# Patient Record
Sex: Female | Born: 1985 | Race: White | Hispanic: Yes | Marital: Single | Smoking: Never smoker
Health system: Southern US, Community
[De-identification: ages and names within clinical notes are randomized; demographics above are authoritative.]

---

## 2008-12-14 ENCOUNTER — Ambulatory Visit: Payer: Self-pay | Admitting: Physician Assistant

## 2008-12-14 ENCOUNTER — Inpatient Hospital Stay (HOSPITAL_COMMUNITY): Admission: AD | Admit: 2008-12-14 | Discharge: 2008-12-15 | Payer: Self-pay | Admitting: Obstetrics & Gynecology

## 2009-03-19 ENCOUNTER — Ambulatory Visit (HOSPITAL_COMMUNITY): Admission: RE | Admit: 2009-03-19 | Discharge: 2009-03-19 | Payer: Self-pay | Admitting: Obstetrics & Gynecology

## 2009-07-02 ENCOUNTER — Ambulatory Visit: Payer: Self-pay | Admitting: Obstetrics and Gynecology

## 2009-07-02 ENCOUNTER — Inpatient Hospital Stay (HOSPITAL_COMMUNITY): Admission: AD | Admit: 2009-07-02 | Discharge: 2009-07-04 | Payer: Self-pay | Admitting: Family Medicine

## 2010-07-12 IMAGING — US US OB DETAIL+14 WK
1 of 2 series · 14 of 28 positions shown · non-contrast
Comparison: none

OBSTETRICAL ULTRASOUND:
 This ultrasound exam was performed in the [HOSPITAL] Ultrasound Department.  The OB US report was generated in the AS system, and faxed to the ordering physician.  This report is also available in [HOSPITAL]?s accessANYware and in [REDACTED] PACS.

[Series 1: us ob detail +14 wk · 0.21mm/px · 79 acquisitions, 14 frames shown]
[im 1/79]
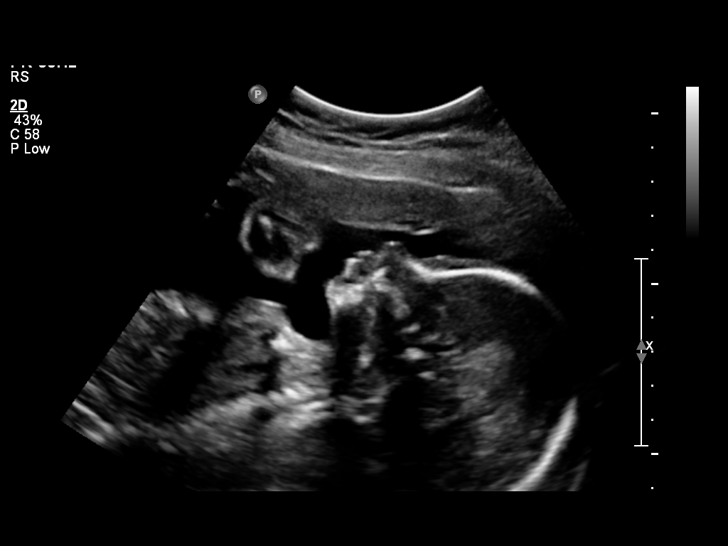
[im 7/79]
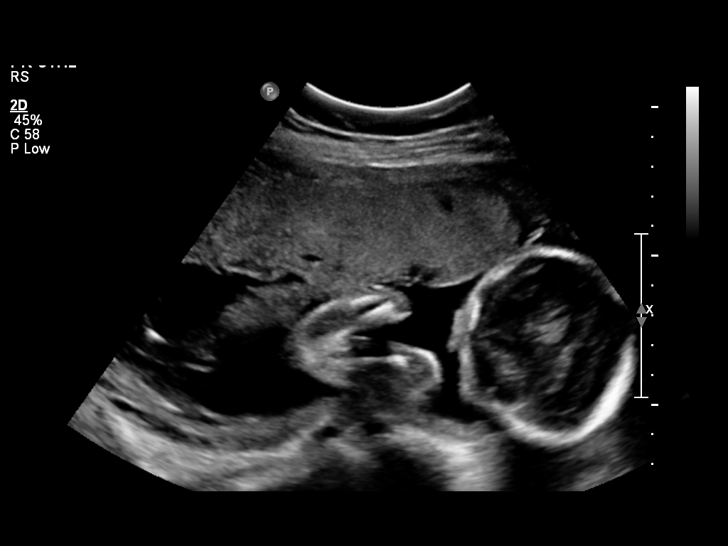
[im 13/79]
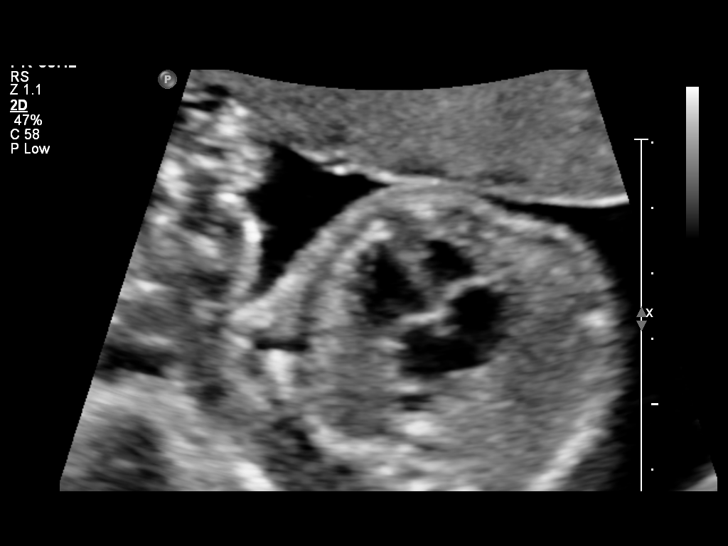
[im 19/79]
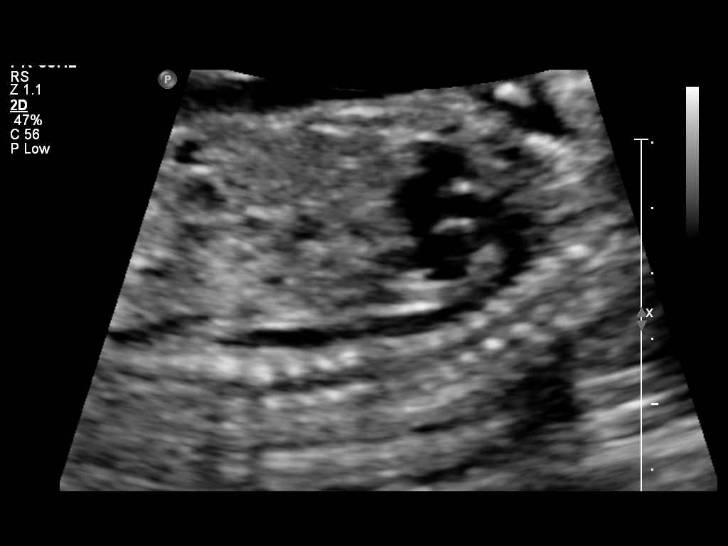
[im 25/79]
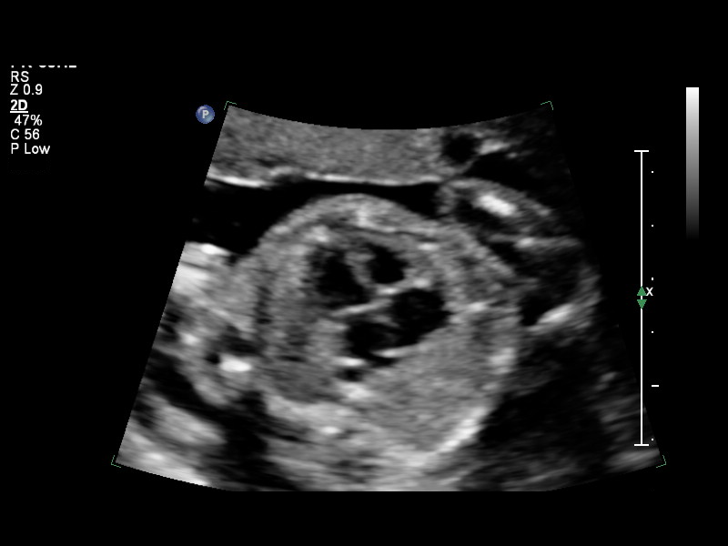
[im 31/79]
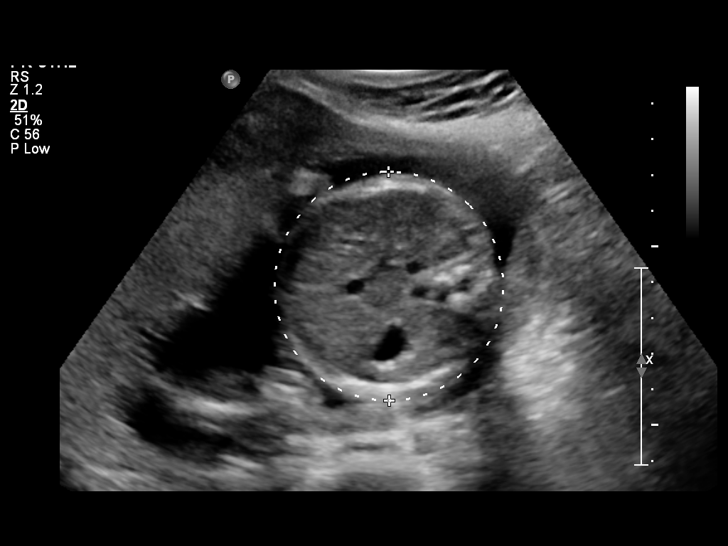
[im 37/79]
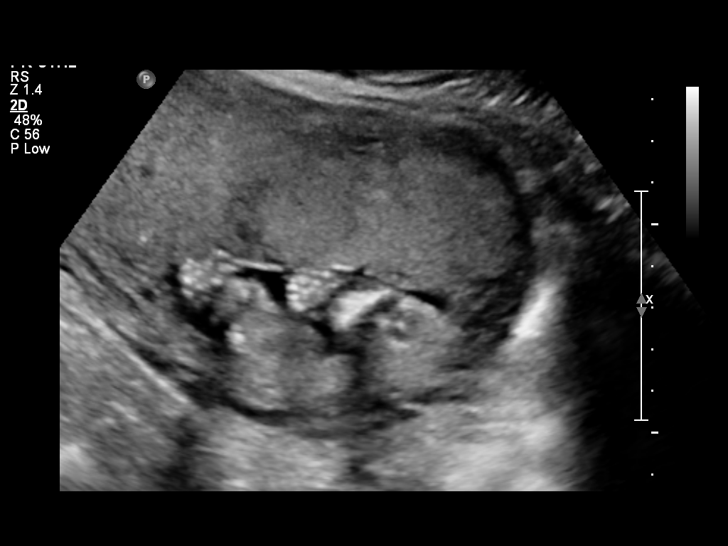
[im 43/79]
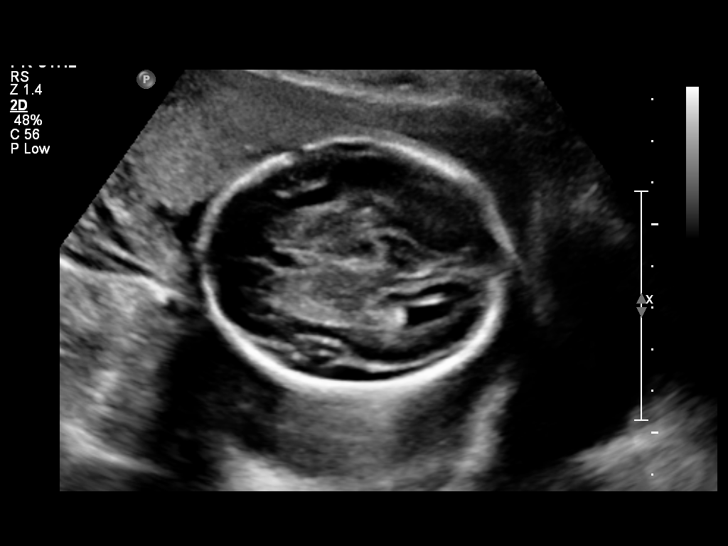
[im 49/79]
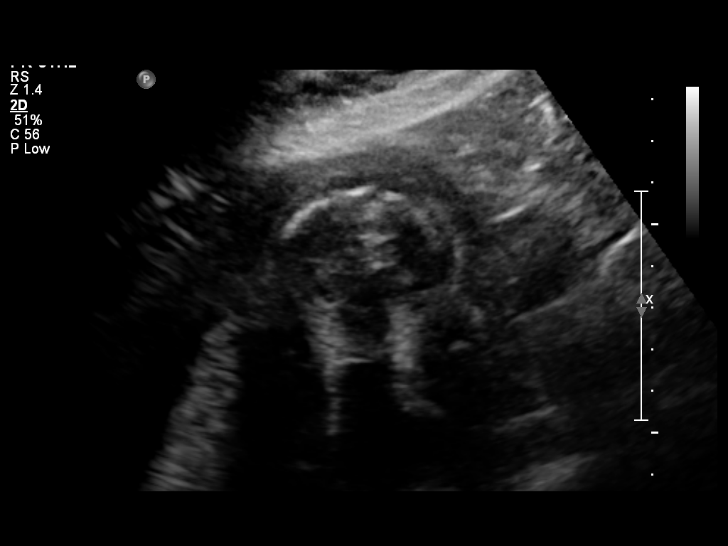
[im 55/79]
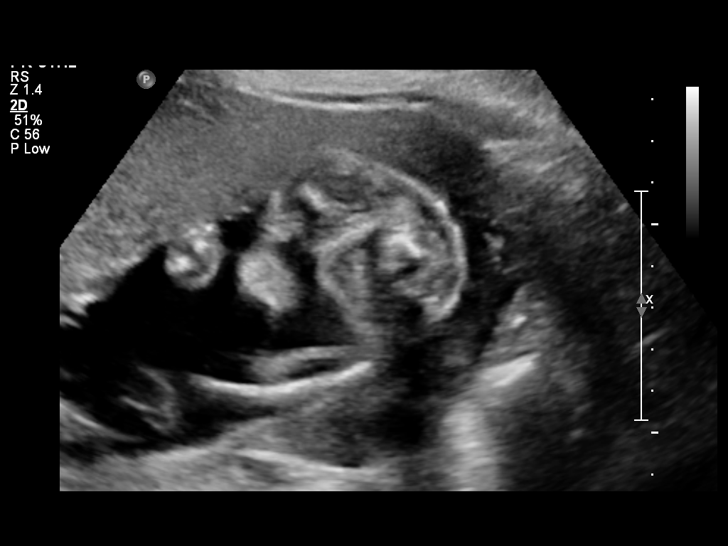
[im 61/79]
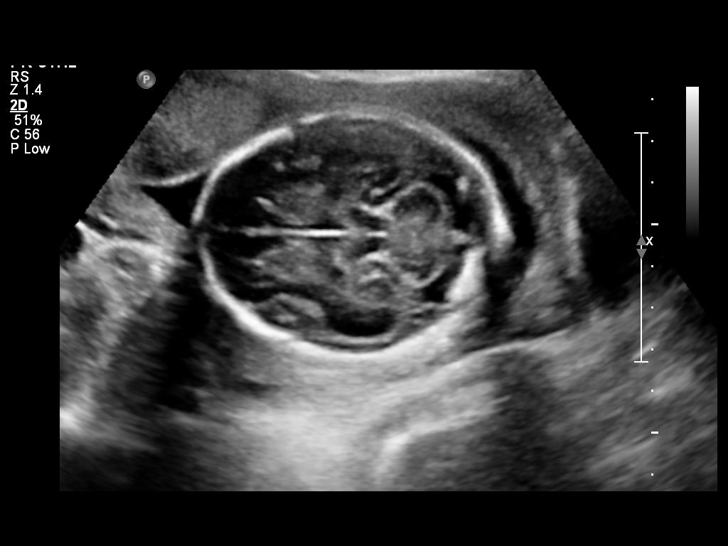
[im 67/79]
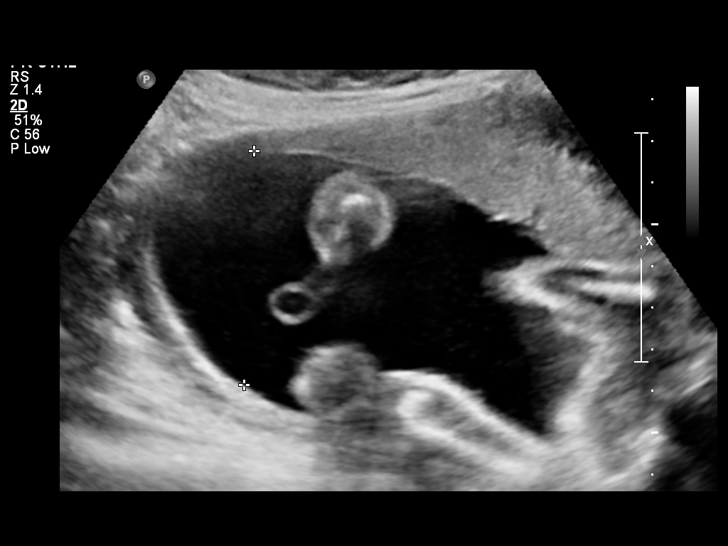
[im 73/79]
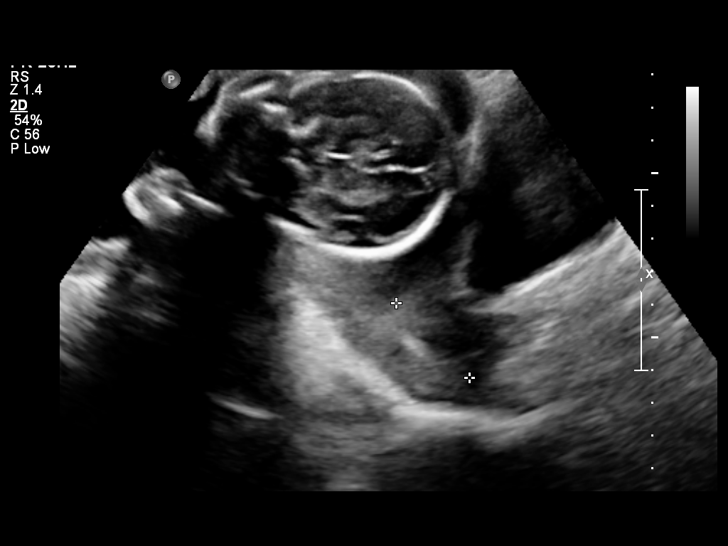
[im 79/79]
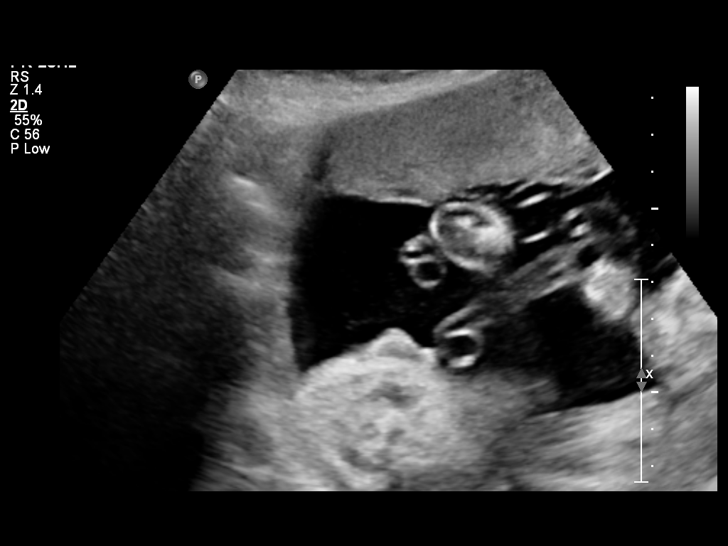

[14 of 28 positions shown; findings below may reference images not displayed]

IMPRESSION: See AS Obstetric US report.

## 2010-09-01 LAB — COMPREHENSIVE METABOLIC PANEL
ALT: 15 U/L (ref 0–35)
AST: 22 U/L (ref 0–37)
Albumin: 3.5 g/dL (ref 3.5–5.2)
GFR calc Af Amer: >60 mL/min (ref >60)
Potassium: 4 mEq/L (ref 3.5–5.1)
Total Bilirubin: 0.3 mg/dL (ref 0.3–1.2)

## 2010-09-01 LAB — CBC
HCT: 45.4 % (ref 36.0–46.0)
Hemoglobin: 15.1 g/dL — ABNORMAL HIGH (ref 12.0–15.0)
MCHC: 33 g/dL (ref 30.0–36.0)
MCHC: 33.4 g/dL (ref 30.0–36.0)
MCV: 85.3 fL (ref 78.0–100.0)
MCV: 86.5 fL (ref 78.0–100.0)
Platelets: 229 10*3/uL (ref 150–400)
Platelets: 269 10*3/uL (ref 150–400)
RBC: 5.32 MIL/uL — ABNORMAL HIGH (ref 3.87–5.11)
RDW: 15.1 % (ref 11.5–15.5)
RDW: 15.2 % (ref 11.5–15.5)

## 2010-09-01 LAB — RPR: RPR Ser Ql: NONREACTIVE

## 2010-09-01 LAB — LACTATE DEHYDROGENASE: LDH: 215 U/L (ref 94–250)

## 2010-09-22 LAB — WET PREP, GENITAL
Clue Cells Wet Prep HPF POC: NONE SEEN
Trich, Wet Prep: NONE SEEN
Yeast Wet Prep HPF POC: NONE SEEN

## 2010-09-22 LAB — URINALYSIS, ROUTINE W REFLEX MICROSCOPIC
Bilirubin Urine: NEGATIVE
Glucose, UA: NEGATIVE mg/dL
Ketones, ur: NEGATIVE mg/dL
Leukocytes, UA: NEGATIVE
Nitrite: NEGATIVE
Protein, ur: NEGATIVE mg/dL
Urobilinogen, UA: 0.2 mg/dL (ref 0.0–1.0)

## 2010-09-22 LAB — URINE MICROSCOPIC-ADD ON: WBC, UA: NONE SEEN WBC/hpf (ref <3)

## 2010-09-22 LAB — GC/CHLAMYDIA PROBE AMP, GENITAL: GC Probe Amp, Genital: NEGATIVE

## 2015-01-02 ENCOUNTER — Ambulatory Visit: Payer: Self-pay | Attending: Internal Medicine

## 2016-01-17 LAB — OB RESULTS CONSOLE GC/CHLAMYDIA
Chlamydia: NEGATIVE
Gonorrhea: NEGATIVE

## 2016-01-17 LAB — OB RESULTS CONSOLE ABO/RH: "RH Type ": POSITIVE

## 2016-01-17 LAB — OB RESULTS CONSOLE RPR: RPR: NONREACTIVE

## 2016-01-17 LAB — OB RESULTS CONSOLE HIV ANTIBODY (ROUTINE TESTING): HIV: NONREACTIVE

## 2016-01-17 LAB — OB RESULTS CONSOLE ANTIBODY SCREEN: Antibody Screen: NEGATIVE

## 2016-01-17 LAB — OB RESULTS CONSOLE RUBELLA ANTIBODY, IGM: Rubella: IMMUNE

## 2016-01-17 LAB — OB RESULTS CONSOLE HEPATITIS B SURFACE ANTIGEN: Hepatitis B Surface Ag: NEGATIVE

## 2016-06-16 NOTE — L&D Delivery Note (Signed)
OB Delivery Summary Note  31 y.o. G3P2002 at 5826w0d delivered a viable female infant in cephalic, ROA position. There was no nuchal cord present. The anterior shoulder delivered with ease. A 60 sec delayed cord clamping was performed. Cord clamped x2 and cut. Placenta delivered spontaneously intact, with 3VC. Fundus firm on exam with massage and pitocin. Good hemostasis noted.  Anesthesia: No epidural Laceration: None Suture: None Good hemostasis noted. EBL: 100 cc  Mom and baby recovering in LDR.    Apgars: APGAR (1 MIN): 9   APGAR (5 MINS): 9   Weight: Pending skin to skin    Gorden HarmsMegan Campbell, MD PGY-2 07/07/2016, 4:10 PM   OB FELLOW DELIVERY ATTESTATION  I was gloved and present for the delivery in its entirety, and I agree with the above resident's note.    Jen MowElizabeth Mumaw, DO OB Fellow 4:23 PM

## 2016-06-23 LAB — OB RESULTS CONSOLE GBS: GBS: NEGATIVE

## 2016-07-07 ENCOUNTER — Encounter (HOSPITAL_COMMUNITY): Payer: Self-pay | Admitting: *Deleted

## 2016-07-07 ENCOUNTER — Inpatient Hospital Stay (HOSPITAL_COMMUNITY)
Admission: AD | Admit: 2016-07-07 | Discharge: 2016-07-08 | DRG: 775 | Disposition: A | Discharge status: Home / Self Care | Payer: Medicaid Other | Source: Ambulatory Visit | Attending: Obstetrics & Gynecology | Admitting: Obstetrics & Gynecology

## 2016-07-07 DIAGNOSIS — Z3493 Encounter for supervision of normal pregnancy, unspecified, third trimester: Secondary | ICD-10-CM | POA: Diagnosis present

## 2016-07-07 DIAGNOSIS — Z3A39 39 weeks gestation of pregnancy: Secondary | ICD-10-CM

## 2016-07-07 DIAGNOSIS — Z3490 Encounter for supervision of normal pregnancy, unspecified, unspecified trimester: Secondary | ICD-10-CM

## 2016-07-07 LAB — CBC
HCT: 40.1 % (ref 36.0–46.0)
Hemoglobin: 13.7 g/dL (ref 12.0–15.0)
MCH: 27.5 pg (ref 26.0–34.0)
MCHC: 34.2 g/dL (ref 30.0–36.0)
MCV: 80.5 fL (ref 78.0–100.0)
PLATELETS: 287 10*3/uL (ref 150–400)
RBC: 4.98 MIL/uL (ref 3.87–5.11)
RDW: 15.5 % (ref 11.5–15.5)
WBC: 12.7 10*3/uL — ABNORMAL HIGH (ref 4.0–10.5)

## 2016-07-07 LAB — TYPE AND SCREEN
ABO/RH(D): O POS
Antibody Screen: NEGATIVE

## 2016-07-07 LAB — ABO/RH: ABO/RH(D): O POS

## 2016-07-07 LAB — RPR: RPR Ser Ql: NONREACTIVE

## 2016-07-07 MED ORDER — BENZOCAINE-MENTHOL 20-0.5 % EX AERO: 1.0000 "application " | INHALATION_SPRAY | CUTANEOUS | Status: DC | PRN

## 2016-07-07 MED ORDER — OXYCODONE-ACETAMINOPHEN 5-325 MG PO TABS: 2.0000 | ORAL_TABLET | ORAL | Status: DC | PRN

## 2016-07-07 MED ORDER — LACTATED RINGERS IV SOLN: INTRAVENOUS | Status: DC

## 2016-07-07 MED ORDER — SIMETHICONE 80 MG PO CHEW: 80.0000 mg | CHEWABLE_TABLET | ORAL | Status: DC | PRN

## 2016-07-07 MED ORDER — FENTANYL CITRATE (PF) 100 MCG/2ML IJ SOLN
100.0000 ug | INTRAMUSCULAR | Status: DC | PRN
  Administered 2016-07-07 (×2): 100 ug via INTRAVENOUS
  Filled 2016-07-07 (×2): qty 2

## 2016-07-07 MED ORDER — LACTATED RINGERS IV SOLN: 500.0000 mL | INTRAVENOUS | Status: DC | PRN

## 2016-07-07 MED ORDER — LIDOCAINE HCL (PF) 1 % IJ SOLN
30.0000 mL | INTRAMUSCULAR | Status: DC | PRN
Start: 2016-07-07 — End: 2016-07-07
  Filled 2016-07-07: qty 30

## 2016-07-07 MED ORDER — SOD CITRATE-CITRIC ACID 500-334 MG/5ML PO SOLN: 30.0000 mL | ORAL | Status: DC | PRN

## 2016-07-07 MED ORDER — COCONUT OIL OIL: 1.0000 "application " | TOPICAL_OIL | Status: DC | PRN

## 2016-07-07 MED ORDER — DIBUCAINE 1 % RE OINT: 1.0000 "application " | TOPICAL_OINTMENT | RECTAL | Status: DC | PRN

## 2016-07-07 MED ORDER — FLEET ENEMA 7-19 GM/118ML RE ENEM: 1.0000 | ENEMA | RECTAL | Status: DC | PRN

## 2016-07-07 MED ORDER — ONDANSETRON HCL 4 MG/2ML IJ SOLN: 4.0000 mg | Freq: Four times a day (QID) | INTRAMUSCULAR | Status: DC | PRN

## 2016-07-07 MED ORDER — ONDANSETRON HCL 4 MG/2ML IJ SOLN: 4.0000 mg | INTRAMUSCULAR | Status: DC | PRN

## 2016-07-07 MED ORDER — OXYCODONE-ACETAMINOPHEN 5-325 MG PO TABS: 1.0000 | ORAL_TABLET | ORAL | Status: DC | PRN

## 2016-07-07 MED ORDER — LIDOCAINE HCL (PF) 1 % IJ SOLN: 30.0000 mL | INTRAMUSCULAR | Status: DC | PRN

## 2016-07-07 MED ORDER — ZOLPIDEM TARTRATE 5 MG PO TABS: 5.0000 mg | ORAL_TABLET | Freq: Every evening | ORAL | Status: DC | PRN

## 2016-07-07 MED ORDER — ACETAMINOPHEN 325 MG PO TABS
650.0000 mg | ORAL_TABLET | ORAL | Status: DC | PRN
  Administered 2016-07-07: 650 mg via ORAL
  Filled 2016-07-07: qty 2

## 2016-07-07 MED ORDER — ONDANSETRON HCL 4 MG PO TABS: 4.0000 mg | ORAL_TABLET | ORAL | Status: DC | PRN

## 2016-07-07 MED ORDER — ACETAMINOPHEN 325 MG PO TABS: 650.0000 mg | ORAL_TABLET | ORAL | Status: DC | PRN

## 2016-07-07 MED ORDER — PRENATAL MULTIVITAMIN CH
1.0000 | ORAL_TABLET | Freq: Every day | ORAL | Status: DC
  Administered 2016-07-08: 1 via ORAL
  Filled 2016-07-07: qty 1

## 2016-07-07 MED ORDER — TETANUS-DIPHTH-ACELL PERTUSSIS 5-2.5-18.5 LF-MCG/0.5 IM SUSP: 0.5000 mL | Freq: Once | INTRAMUSCULAR | Status: DC

## 2016-07-07 MED ORDER — LACTATED RINGERS IV SOLN
INTRAVENOUS | Status: DC
Start: 2016-07-07 — End: 2016-07-07
  Administered 2016-07-07: 09:00:00 via INTRAVENOUS

## 2016-07-07 MED ORDER — OXYTOCIN BOLUS FROM INFUSION
500.0000 mL | Freq: Once | INTRAVENOUS | Status: AC
  Administered 2016-07-07: 500 mL via INTRAVENOUS

## 2016-07-07 MED ORDER — OXYTOCIN BOLUS FROM INFUSION: 500.0000 mL | Freq: Once | INTRAVENOUS | Status: DC

## 2016-07-07 MED ORDER — OXYTOCIN 40 UNITS IN LACTATED RINGERS INFUSION - SIMPLE MED: 2.5000 [IU]/h | INTRAVENOUS | Status: DC

## 2016-07-07 MED ORDER — WITCH HAZEL-GLYCERIN EX PADS: 1.0000 "application " | MEDICATED_PAD | CUTANEOUS | Status: DC | PRN

## 2016-07-07 MED ORDER — OXYTOCIN 40 UNITS IN LACTATED RINGERS INFUSION - SIMPLE MED
2.5000 [IU]/h | INTRAVENOUS | Status: DC
  Administered 2016-07-07: 2.5 [IU]/h via INTRAVENOUS
  Filled 2016-07-07: qty 1000

## 2016-07-07 MED ORDER — DIPHENHYDRAMINE HCL 25 MG PO CAPS: 25.0000 mg | ORAL_CAPSULE | Freq: Four times a day (QID) | ORAL | Status: DC | PRN

## 2016-07-07 MED ORDER — IBUPROFEN 600 MG PO TABS
600.0000 mg | ORAL_TABLET | Freq: Four times a day (QID) | ORAL | Status: DC
  Administered 2016-07-07 – 2016-07-08 (×4): 600 mg via ORAL
  Filled 2016-07-07 (×4): qty 1

## 2016-07-07 MED ORDER — SENNOSIDES-DOCUSATE SODIUM 8.6-50 MG PO TABS
2.0000 | ORAL_TABLET | ORAL | Status: DC
  Administered 2016-07-08: 2 via ORAL
  Filled 2016-07-07: qty 2

## 2016-07-07 NOTE — MAU Note (Signed)
Pt C/O uc's since 0400, has spotting, denies LOF.

## 2016-07-07 NOTE — Progress Notes (Signed)
I assisted RN Morrie Sheldonshley with admission questions, ordered pt meals too , by Orlan LeavensViria Alvarez Spanish Interpreter.

## 2016-07-07 NOTE — Progress Notes (Signed)
Admission education and paperwork completed with in house interpreter Viria.  Explained baby safety and fall safety.  Instructed pt not to get up without calling for nurse to come to room to assist.  Pt verbalizes understanding.

## 2016-07-07 NOTE — Lactation Note (Signed)
This note was copied from a baby's chart. Lactation Consultation Note  Patient Name: Tara Pearson'UToday's Date: 07/07/2016 Reason for consult: Initial assessment   Baby STS after delivery with mom.  Baby cueing and rooting.  Interpreter at bedside with LC.  Mom has previous BF experience of 2/12 years with first child now 13 yr. Old and 1 year of BF with now 723 year old.  Mom wishes to formula and bf her infant.  Mom states she fed both to her other children as well.  LC facilitated getting mom in a comfortable position, providing pillows for baby to latch.  Infant latched on right side in cradle hold.  Baby had good rhythmic jaw motions and swallows were heard with breast compression and breast massage.  Mom was taught hand expression and multiple gtts of colostrum were noted upon expression.  With interpreter LC reviewed LC services and support groups as well as bf resource sheet.  Mom expressed that infant is already breastfeeding easier than the other two of her children.  Mom understands to call out for further questions or concerns.    Maternal Data Formula Feeding for Exclusion: No Has patient been taught Hand Expression?: Yes (with interpreter at bedside) Does the patient have breastfeeding experience prior to this delivery?: Yes (bf 2 1/2 years with 1st child 90(13 yr old).  1 year with 143 year old)  Feeding Feeding Type: Breast Fed Length of feed: 45 min  LATCH Score/Interventions Latch: Grasps breast easily, tongue down, lips flanged, rhythmical sucking.  Audible Swallowing: Spontaneous and intermittent  Type of Nipple: Everted at rest and after stimulation  Comfort (Breast/Nipple): Soft / non-tender     Hold (Positioning): Assistance needed to correctly position infant at breast and maintain latch. Intervention(s): Breastfeeding basics reviewed;Support Pillows;Position options;Skin to skin  LATCH Score: 9  Lactation Tools Discussed/Used     Consult Status Consult  Status: Follow-up Date: 07/08/16 Follow-up type: In-patient    Maryruth HancockKelly Suzanne [facility] 07/07/2016, 4:59 PM

## 2016-07-07 NOTE — H&P (Signed)
LABOR AND DELIVERY ADMISSION HISTORY AND PHYSICAL NOTE  Tara Pearson is a 31 y.o. female 313P2002 with IUP at 3022w0d by LMP presenting for spontaneous onset of labor with increasing strength and intensity of contractions.   She reports positive fetal movement. She denies leakage of fluid or vaginal bleeding.  Prenatal History/Complications: None  Past Medical History: History reviewed. No pertinent past medical history.  Past Surgical History: History reviewed. No pertinent surgical history.  Obstetrical History: OB History    Gravida Para Term Preterm AB Living   3 2 2     2    SAB TAB Ectopic Multiple Live Births                  Social History: Social History   Social History  . Marital status: Single    Spouse name: N/A  . Number of children: N/A  . Years of education: N/A   Social History Main Topics  . Smoking status: Never Smoker  . Smokeless tobacco: Never Used  . Alcohol use No  . Drug use: No  . Sexual activity: Not Asked   Other Topics Concern  . None   Social History Narrative  . None    Family History: History reviewed. No pertinent family history.  Allergies: No Known Allergies  Prescriptions Prior to Admission  Medication Sig Dispense Refill Last Dose  . Prenatal Vit-Fe Fumarate-FA (MULTIVITAMIN-PRENATAL) 27-0.8 MG TABS tablet Take 1 tablet by mouth daily at 12 noon.   07/06/2016     Review of Systems   All systems reviewed and negative except as stated in HPI  Blood pressure 125/90, pulse 79, temperature 98.1 F (36.7 C), temperature source Oral, resp. rate 20, height 5\' 2"  (1.575 m), weight 191 lb (86.6 kg), SpO2 100 %. General appearance: alert, cooperative and no distress Lungs: clear to auscultation bilaterally Heart: regular rate and rhythm Abdomen: soft, non-tender; bowel sounds normal Extremities: No calf swelling or tenderness Presentation: cephalic Fetal monitoring: FHT with baseline in the 130s, +accels, - decels, mod  variability Uterine activity: appears to have ctx approximately every 3 minutes, minimal detection on monitor Dilation: 4.5 Effacement (%): 70 Station: -3 Exam by:: Janeth Rasehristina Robinson RNC   Prenatal labs: ABO, Rh: O/Positive/-- (08/03 0000) Antibody: Negative (08/03 0000) Rubella: !Error! RPR: Nonreactive (08/03 0000)  HBsAg: Negative (08/03 0000)  HIV: Non-reactive (08/03 0000)  GBS: Negative (01/08 0000)  1 hr Glucola: normal Genetic screening: normal Anatomy US: normal  Prenatal Transfer Tool  Maternal Diabetes: No Genetic Screening: Normal Maternal Ultrasounds/Referrals: Normal Fetal Ultrasounds or other Referrals:  None Maternal Substance Abuse:  No Significant Maternal Medications:  None Significant Maternal Lab Results: Lab values include: Group B Strep negative  Results for orders placed or performed during the hospital encounter of 07/07/16 (from the past 24 hour(s))  CBC   Collection Time: 07/07/16  8:57 AM  Result Value Ref Range   WBC 12.7 (H) 4.0 - 10.5 K/uL   RBC 4.98 3.87 - 5.11 MIL/uL   Hemoglobin 13.7 12.0 - 15.0 g/dL   HCT 62.140.1 30.836.0 - 65.746.0 %   MCV 80.5 78.0 - 100.0 fL   MCH 27.5 26.0 - 34.0 pg   MCHC 34.2 30.0 - 36.0 g/dL   RDW 84.615.5 96.211.5 - 95.215.5 %   Platelets 287 150 - 400 K/uL    Patient Active Problem List   Diagnosis Date Noted  . Indication for care in labor or delivery 07/07/2016    Assessment: Tara Pearson is a 31  y.o. [id number] at [redacted]w[redacted]d here for spontaneous onset of labor.   #Labor:Admitted with plan to continue with expectant management.  #Pain: Will try IV and PO pain medications. Consider Epidural when appropriate.  #FWB: Cat I #ID:  GBS negative- no abx indicated #MOF: breast, may supplement with bottle #MOC: Considering BTL, uncertain if papers have been signed.  #Circ:  N/A  Lise Auer, MD PGY-2 07/07/2016, 9:52 AM   OB FELLOW HISTORY AND PHYSICAL ATTESTATION  I have seen and examined this patient; I agree with  above documentation in the resident's note.    Jen Mow, DO OB Fellow 07/07/2016, 9:59 AM

## 2016-07-07 NOTE — Anesthesia Pain Management Evaluation Note (Signed)
  CRNA Pain Management Visit Note  Patient: Tara Pearson, 31 y.o., female  "Hello [facility] am a [facility]. We have an anesthesia team available at all times to provide care throughout the hospital, including epidural management and anesthesia for C-section. I don't know your plan for the delivery whether it a natural birth, water birth, IV sedation, nitrous supplementation, doula or epidural, but we want to meet your pain goals."   1.Was your pain managed to your expectations on prior hospitalizations?   Yes   2.What is your expectation for pain management during this hospitalization?     Labor support without medications  3.How can we help you reach that goal?   Record the patient's initial score and the patient's pain goal.   Pain: 8  Pain Goal: 10 [facility] wants you to be able to say your pain was always managed very well.  Laban EmperorMalinova,Cherrie Franca Hristova 07/07/2016

## 2016-07-08 DIAGNOSIS — Z3A39 39 weeks gestation of pregnancy: Secondary | ICD-10-CM

## 2016-07-08 LAB — CBC
HCT: 39.1 % (ref 36.0–46.0)
HEMOGLOBIN: 13.2 g/dL (ref 12.0–15.0)
MCH: 27 pg (ref 26.0–34.0)
MCHC: 33.8 g/dL (ref 30.0–36.0)
MCV: 80.1 fL (ref 78.0–100.0)
Platelets: 267 10*3/uL (ref 150–400)
RBC: 4.88 MIL/uL (ref 3.87–5.11)
RDW: 15.7 % — ABNORMAL HIGH (ref 11.5–15.5)
WBC: 17.7 10*3/uL — ABNORMAL HIGH (ref 4.0–10.5)

## 2016-07-08 MED ORDER — IBUPROFEN 600 MG PO TABS
600.0000 mg | ORAL_TABLET | Freq: Four times a day (QID) | ORAL | 0 refills | Status: AC
Start: 2016-07-08 — End: ?

## 2016-07-08 MED ORDER — SENNOSIDES-DOCUSATE SODIUM 8.6-50 MG PO TABS: 2.0000 | ORAL_TABLET | ORAL | Status: AC

## 2016-07-08 NOTE — Discharge Summary (Signed)
OB Discharge Summary     Patient Name: Tara Pearson DOB: 08-21-1985 MRN: [id number]  Date of admission: 07/07/2016 Delivering MD: Gorden Harms C   Date of discharge: 07/08/2016  Admitting diagnosis: 39WKS,PAIN Intrauterine pregnancy: [redacted]w[redacted]d     Secondary diagnosis:  Principal Problem:   SVD (spontaneous vaginal delivery) Active Problems:   Indication for care in labor or delivery   Pregnant and not yet delivered  Additional problems: None     Discharge diagnosis: Term Pregnancy Delivered                                                                                                Post partum procedures:None  Augmentation: None  Complications: None  Hospital course:  Onset of Labor With Vaginal Delivery     31 y.o. yo [id number] at [redacted]w[redacted]d was admitted in Active Labor on 07/07/2016. Patient had an uncomplicated labor course as follows:  Membrane Rupture Time/Date: 2:45 PM ,07/07/2016   Intrapartum Procedures: Episiotomy: None [1]                                         Lacerations:  None [1]  Patient had a delivery of a Viable infant. 07/07/2016  Information for the patient's newborn:  Yani, [employer] Girl Akina [[id number]]  Delivery Method: Vag-Spont    Pateint had an uncomplicated postpartum course.  She is ambulating, tolerating a regular diet, passing flatus, and urinating well. Patient is discharged home in stable condition on 07/08/16.   Physical exam  Vitals:   07/07/16 1631 07/07/16 1644 07/07/16 1745 07/07/16 2145  BP: 115/67 118/70 (!) 120/58 101/61  Pulse: 63 67 82 75  Resp:  (!) 24 18   Temp:  98.4 F (36.9 C) 97.8 F (36.6 C) 98.1 F (36.7 C)  TempSrc:  Oral Axillary Axillary  SpO2:   97%   Weight:      Height:       General: alert, cooperative and no distress Lochia: appropriate Uterine Fundus: firm Incision: N/A DVT Evaluation: No evidence of DVT seen on physical exam. No significant calf/ankle edema. Labs: Lab Results  Component Value Date   WBC 17.7 (H) 07/08/2016   HGB 13.2 07/08/2016   HCT 39.1 07/08/2016   MCV 80.1 07/08/2016   PLT 267 07/08/2016   CMP Latest Ref Rng & Units 07/02/2009  Glucose 70 - 99 mg/dL 74  BUN 6 - 23 mg/dL 10  Creatinine 0.4 - 1.2 mg/dL 6.96  Sodium 295 - 284 mEq/L 137  Potassium 3.5 - 5.1 mEq/L 4.0  Chloride 96 - 112 mEq/L 101  CO2 19 - 32 mEq/L 25  Calcium 8.4 - 10.5 mg/dL 9.8  Total Protein 6.0 - 8.3 g/dL 7.6  Total Bilirubin 0.3 - 1.2 mg/dL 0.3  Alkaline Phos 39 - 117 U/L 258(H)  AST 0 - 37 U/L 22  ALT 0 - 35 U/L 15    Discharge instruction: per After Visit Summary and "Baby and Me Booklet".  After visit meds:  Allergies as  of 07/08/2016   No Known Allergies     Medication List    TAKE these medications   ibuprofen 600 MG tablet Commonly known as:  ADVIL,MOTRIN Take 1 tablet (600 mg total) by mouth every 6 (six) hours.   multivitamin-prenatal 27-0.8 MG Tabs tablet Take 1 tablet by mouth daily at 12 noon.   senna-docusate 8.6-50 MG tablet Commonly known as:  Senokot-S Take 2 tablets by mouth daily. Start taking on:  07/09/2016       Diet: routine diet  Activity: Advance as tolerated. Pelvic rest for 6 weeks.   Outpatient follow up:6 weeks Follow up Appt:No future appointments. Follow up Visit:No Follow-up on file.  Postpartum contraception: Undecided  Newborn Data: Live born female  Birth Weight: 6 lb 12.1 oz (3065 g) APGAR: 9, 9  Baby Feeding: Bottle and Breast Disposition:home with mother   07/08/2016 Gorden HarmsMegan Makayla Lanter, MD

## 2016-07-08 NOTE — Plan of Care (Signed)
Problem: Activity: Goal: Ability to tolerate increased activity will improve Outcome: Completed/Met Date Met: 07/08/16 Pt ambulating independently in the room.  Encouraged to ambulate in the hallway.

## 2016-07-08 NOTE — Progress Notes (Signed)
Ordered patients meals,  by Orlan LeavensViria Alvarez Spanish Interpreter.

## 2016-07-08 NOTE — Discharge Instructions (Signed)
Parto vaginal, cuidados de puerperio  (Postpartum Care After Vaginal Delivery)  El período de tiempo que sigue inmediatamente al parto se conoce como puerperio.  ¿QUÉ TIPO DE ATENCIÓN MÉDICA RECIBIRÉ?  · Podría continuar recibiendo medicamentos y líquidos través de una vía intravenosa (IV) que se colocará en una de sus venas.  · Si se le realizó una incisión cerca de la vagina (episiotomía) o si ha tenido algún desgarro durante el parto, podrían indicarle que se coloque compresas frías sobre la episiotomía o el desgarro. Esto ayuda a aliviar el dolor y la hinchazón.  · Es posible que le den una botella rociadora para que use cuando vaya al baño. Puede utilizarla hasta que se sienta cómoda limpiándose de la manera habitual. Siga los pasos a continuación para usar la botella rociadora:  ? Antes de orinar, llene la botella rociadora con agua tibia. No use agua caliente.  ? Después de orinar, mientras aún está sentada en el inodoro, use la botella rociadora para enjuagar el área alrededor de la uretra y la abertura vaginal. Con esto podrá limpiar cualquier rastro de orina y sangre.  ? Puede hacer esto en lugar de secarse. Cuando comience a sanar, podrá usar la botella rociadora antes de secarse. Asegúrese de secarse suavemente.  ? Llene la botella rociadora con agua limpia cada vez que vaya al baño.  · Deberá usar apósitos sanitarios.    ¿CÓMO PUEDO SENTIRME?  · Quizás no tenga necesidad de orinar durante varias horas después del parto.  · Sentirá algo de dolor y molestias en el abdomen y la vagina.  · Si está amamantando, podría tener contracciones uterinas cada vez que lo haga. Estas podrían prolongarse hasta varias semanas durante el puerperio. Las contracciones uterinas ayudan al útero a regresar a su tamaño habitual.  · Es normal tener un poco de hemorragia vaginal (loquios) después del parto. La cantidad y apariencia de los loquios a menudo es similar a las del período menstrual la primera semana después del  parto. Disminuirá gradualmente las siguientes semanas hasta convertirse en una descarga seca amarronada o amarillenta. En la mayoría de las mujeres, los loquios se detienen completamente entre 6 a 8 semanas después del parto. Los sangrados vaginales pueden variar de mujer a mujer.  · Los primeros días después del parto, podría padecer congestión mamaria. Los pechos se sentirán pesados, llenos y molestos. Las mamas también podrían latir y ponerse duras, muy tirantes, calientes y sensibles al tacto. Cuando esto ocurra, podría notar leche que se escapa de los senos. El médico puede recomendarle algunos métodos para aliviar este malestar causado por la congestión mamaria. La congestión mamaria debería desaparecer al cabo de unos días.  · Podría sentirse más deprimida o preocupada que lo habitual debido a los cambios hormonales luego del parto. Estos sentimientos no deben durar más de unos pocos días. Si no desaparecen al cabo de algunos días, hable con su médico.    ¿QUÉ CUIDADOS DEBO TENER?  · Infórmele a su médico si siente dolor o malestar.  · Beba suficiente agua para mantener la orina clara o de color amarillo pálido.  · Lávese bien las manos con agua y jabón durante al menos 20 segundos después de cambiar el apósito sanitario, usar el baño o antes de sostener o alimentar al bebé.  · Si no está amamantando, evite tocarse mucho los senos. Al hacerlo, podrían producir más leche.  · Si se siente débil o mareada, o si siente que está a punto de desmayarse, pida ayuda antes de realizar lo   siguiente:  ? Levantarse de la cama.  ? Ducharse.  · Cambie los apósitos sanitarios con frecuencia. Observe si hay cambios en el flujo, como un aumento repentino en el volumen, cambios en el color o coágulos sanguíneos de gran tamaño. Si expulsa un coágulo sanguíneo por la vagina, guárdelo para mostrárselo a su médico. No tire la cadena sin que el médico examine el coágulo antes.  · Asegúrese de tener todas las vacunas al día. Esto la  ayudará a estar protegida y a proteger al bebé de determinadas enfermedades. Podría necesitar vacunas antes de dejar el hospital.  · Si lo desea, hable con el médico acerca de los métodos de planificación familiar o control de la natalidad (métodos anticonceptivos).    ¿CÓMO PUEDO ESTABLECER LAZOS CON MI BEBÉ?  Pasar tanto tiempo como le sea posible con el bebé es sumamente importante. Durante ese tiempo, usted y su bebé pueden conocerse y desarrollar lazos. Tener al bebé con usted en la habitación le dará tiempo de conocerlo. Esto también puede hacerla sentir más cómoda para atender al bebé. Amamantar también puede ayudarla a crear lazos con el bebé.  ¿CÓMO PUEDO PLANIFICAR MI REGRESO A CASA CON EL BEBÉ?  · Asegúrese de tener instalada una butaca en el automóvil.  ? La butaca debe contar con la certificación del fabricante para asegurarse de que esté instalada en forma segura.  ? Asegúrese de que el bebé quede bien asegurado en la butaca.  · Pregúntele al médico todo lo que necesite saber sobre los cuidados de su bebé. Asegúrese de poder comunicarse con el médico en caso de que tenga preguntas luego de dejar el hospital.    Esta información no tiene como fin reemplazar el consejo del médico. Asegúrese de hacerle al médico cualquier pregunta que tenga.  Document Released: 03/30/2007 Document Revised: 09/24/2015 Document Reviewed: 05/07/2015  Elsevier Interactive Patient Education © 2017 [employer]

## 2016-07-08 NOTE — Plan of Care (Signed)
Problem: Health Behavior/Discharge Planning: Goal: Ability to manage health-related needs will improve Outcome: Completed/Met Date Met: 07/08/16 Discharge education and paperwork discussed.  Reasons to call the MD reviewed.  Pt verbalizes understanding.  In house interpreter Angelica Chessman used.
